# Patient Record
Sex: Male | Born: 1940 | Hispanic: No | Marital: Married | Smoking: Former smoker
Health system: Southern US, Community
[De-identification: ages and names within clinical notes are randomized; demographics above are authoritative.]

## PROBLEM LIST (undated history)

## (undated) DIAGNOSIS — C801 Malignant (primary) neoplasm, unspecified: Secondary | ICD-10-CM

## (undated) DIAGNOSIS — R059 Cough, unspecified: Secondary | ICD-10-CM

## (undated) DIAGNOSIS — J449 Chronic obstructive pulmonary disease, unspecified: Secondary | ICD-10-CM

## (undated) DIAGNOSIS — R05 Cough: Secondary | ICD-10-CM

## (undated) DIAGNOSIS — C189 Malignant neoplasm of colon, unspecified: Secondary | ICD-10-CM

## (undated) DIAGNOSIS — E785 Hyperlipidemia, unspecified: Secondary | ICD-10-CM

## (undated) DIAGNOSIS — K219 Gastro-esophageal reflux disease without esophagitis: Secondary | ICD-10-CM

## (undated) DIAGNOSIS — D649 Anemia, unspecified: Secondary | ICD-10-CM

## (undated) DIAGNOSIS — H9190 Unspecified hearing loss, unspecified ear: Secondary | ICD-10-CM

## (undated) DIAGNOSIS — R519 Headache, unspecified: Secondary | ICD-10-CM

## (undated) DIAGNOSIS — R51 Headache: Secondary | ICD-10-CM

## (undated) DIAGNOSIS — Z9889 Other specified postprocedural states: Secondary | ICD-10-CM

## (undated) DIAGNOSIS — J4489 Other specified chronic obstructive pulmonary disease: Secondary | ICD-10-CM

## (undated) DIAGNOSIS — E871 Hypo-osmolality and hyponatremia: Secondary | ICD-10-CM

## (undated) DIAGNOSIS — S80219A Abrasion, unspecified knee, initial encounter: Secondary | ICD-10-CM

## (undated) DIAGNOSIS — F0280 Dementia in other diseases classified elsewhere without behavioral disturbance: Secondary | ICD-10-CM

## (undated) DIAGNOSIS — G309 Alzheimer's disease, unspecified: Secondary | ICD-10-CM

## (undated) DIAGNOSIS — I10 Essential (primary) hypertension: Secondary | ICD-10-CM

## (undated) DIAGNOSIS — N200 Calculus of kidney: Secondary | ICD-10-CM

## (undated) HISTORY — DX: Essential (primary) hypertension: I10

## (undated) HISTORY — PX: CARDIAC CATHETERIZATION: SHX172

## (undated) HISTORY — DX: Chronic obstructive pulmonary disease, unspecified: J44.9

## (undated) HISTORY — DX: Hypo-osmolality and hyponatremia: E87.1

## (undated) HISTORY — PX: WRIST SURGERY: SHX841

## (undated) HISTORY — DX: Headache, unspecified: R51.9

## (undated) HISTORY — DX: Hyperlipidemia, unspecified: E78.5

## (undated) HISTORY — DX: Headache: R51

## (undated) HISTORY — DX: Other specified chronic obstructive pulmonary disease: J44.89

## (undated) HISTORY — DX: Alzheimer's disease, unspecified: G30.9

## (undated) HISTORY — DX: Dementia in other diseases classified elsewhere, unspecified severity, without behavioral disturbance, psychotic disturbance, mood disturbance, and anxiety: F02.80

## (undated) HISTORY — DX: Cough, unspecified: R05.9

## (undated) HISTORY — DX: Unspecified hearing loss, unspecified ear: H91.90

## (undated) HISTORY — PX: SHOULDER SURGERY: SHX246

## (undated) HISTORY — DX: Gastro-esophageal reflux disease without esophagitis: K21.9

## (undated) HISTORY — PX: OTHER SURGICAL HISTORY: SHX169

## (undated) HISTORY — DX: Cough: R05

## (undated) HISTORY — DX: Malignant neoplasm of colon, unspecified: C18.9

## (undated) HISTORY — PX: COLONOSCOPY: SHX174

## (undated) HISTORY — DX: Anemia, unspecified: D64.9

---

## 1997-08-25 DIAGNOSIS — C189 Malignant neoplasm of colon, unspecified: Secondary | ICD-10-CM

## 1997-08-25 HISTORY — DX: Malignant neoplasm of colon, unspecified: C18.9

## 2000-08-25 DIAGNOSIS — Z9889 Other specified postprocedural states: Secondary | ICD-10-CM

## 2000-08-25 HISTORY — DX: Other specified postprocedural states: Z98.890

## 2002-02-24 ENCOUNTER — Other Ambulatory Visit: Admission: RE | Admit: 2002-02-24 | Discharge: 2002-02-24 | Payer: Self-pay | Admitting: Dermatology

## 2008-08-25 DIAGNOSIS — Z9889 Other specified postprocedural states: Secondary | ICD-10-CM

## 2008-08-25 HISTORY — DX: Other specified postprocedural states: Z98.890

## 2013-09-29 ENCOUNTER — Encounter (HOSPITAL_COMMUNITY): Payer: Self-pay

## 2013-09-29 ENCOUNTER — Encounter (HOSPITAL_COMMUNITY)
Admission: RE | Admit: 2013-09-29 | Discharge: 2013-09-29 | Disposition: A | Discharge status: Home / Self Care | Payer: Medicare Other | Source: Ambulatory Visit | Attending: Internal Medicine | Admitting: Internal Medicine

## 2013-09-29 VITALS — BP 144/86 | HR 51 | Ht 68.0 in | Wt 184.8 lb

## 2013-09-29 DIAGNOSIS — Z5189 Encounter for other specified aftercare: Secondary | ICD-10-CM | POA: Insufficient documentation

## 2013-09-29 DIAGNOSIS — J8410 Pulmonary fibrosis, unspecified: Secondary | ICD-10-CM | POA: Insufficient documentation

## 2013-09-29 DIAGNOSIS — R0602 Shortness of breath: Secondary | ICD-10-CM | POA: Insufficient documentation

## 2013-09-29 HISTORY — DX: Other specified postprocedural states: Z98.890

## 2013-09-29 HISTORY — DX: Calculus of kidney: N20.0

## 2013-09-29 HISTORY — DX: Malignant (primary) neoplasm, unspecified: C80.1

## 2013-09-29 HISTORY — DX: Abrasion, unspecified knee, initial encounter: S80.219A

## 2013-09-29 NOTE — Patient Instructions (Signed)
Pt has finished orientation and is scheduled to start PR on 10/03/13 at 2:30pm. Pt has been instructed to arrive to class 15 minutes early for scheduled class. Pt has been instructed to wear comfortable clothing and shoes with rubber soles. Pt has been told to take their medications 1 hour prior to coming to class.  If the patient is not going to attend class, he/she has been instructed to call.

## 2013-09-29 NOTE — Progress Notes (Signed)
Patient was referred to PR by Dr. Farris Has in [place], [place] for ILD 515. During orientation advised patient on arrival and appointment times what to wear, what to do before, during and after exercise. Reviewed attendance and class policy. Talked about inclement weather and class consultation policy. Pt is scheduled to start Pulmonary Rehab on 10/03/13 at 2:30. Pt was advised to come to class 5 minutes before class starts. He was also given instructions on meeting with the dietician and attending the Family Structure classes. Pt is eager to get started. Patient was able to do the 6 minute walk test. Also discussed the improtance of pursed lip breathing.

## 2013-10-01 ENCOUNTER — Encounter (HOSPITAL_COMMUNITY): Payer: Self-pay

## 2013-10-03 ENCOUNTER — Encounter (HOSPITAL_COMMUNITY)
Admission: RE | Admit: 2013-10-03 | Discharge: 2013-10-03 | Disposition: A | Discharge status: Home / Self Care | Payer: Medicare Other | Source: Ambulatory Visit | Attending: *Deleted | Admitting: *Deleted

## 2013-10-05 ENCOUNTER — Encounter (HOSPITAL_COMMUNITY)
Admission: RE | Admit: 2013-10-05 | Discharge: 2013-10-05 | Disposition: A | Discharge status: Home / Self Care | Payer: Medicare Other | Source: Ambulatory Visit | Attending: *Deleted | Admitting: *Deleted

## 2013-10-10 ENCOUNTER — Encounter (HOSPITAL_COMMUNITY): Payer: Medicare Other

## 2013-10-12 ENCOUNTER — Encounter (HOSPITAL_COMMUNITY): Payer: Medicare Other

## 2013-10-17 ENCOUNTER — Encounter (HOSPITAL_COMMUNITY)
Admission: RE | Admit: 2013-10-17 | Discharge: 2013-10-17 | Disposition: A | Discharge status: Home / Self Care | Payer: Medicare Other | Source: Ambulatory Visit | Attending: *Deleted | Admitting: *Deleted

## 2013-10-18 NOTE — Progress Notes (Signed)
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  Exercise Program:  Tolerates exercise @ 3.80 METS for 15 minutes and Walk Test Results:  Pre: Pre Walk Test: Pre HR 51, BP 144/86, O2 98%, RPE 7 and RPD 7, 6 minute HR 104, BP 154/84, O2 89%, RPE 11 and RPD 13, Post HR 55, BP 140/74, O2 98% RPE 7 and RPD 9. Walked 1000 feet at 1.89 MPH at 2.45 METS.  B.  Mental Health:  Good mental attitude  C.  Education/Instruction/Skills  Accurately checks own pulse.  Rest:  74  Exercise:  90, Knows THR for exercise and Uses Perceived Exertion Scale and/or Dyspnea Scale  Uses Perceived Exertion Scale and/or Dyspnea Scale  D.  Nutrition/Weight Control/Body Composition:  Adherence to prescribed nutrition program: good    E.  Blood Lipids    No results found for this basename: CHOL, HDL, LDLCALC, LDLDIRECT, TRIG, CHOLHDL    F.  Lifestyle Changes:  Making positive lifestyle changes and Not smoking:  Quit 1991  G.  Symptoms noted with exercise:  Asymptomatic  Report Completed By:  Oletta Lamas. Edeline Greening RN   Comments:  This is patients 1st week report. He achieved a Peak METS of 3.80.  His resting HR was 74 and resting BP was 118/74, His peak HR was 90 and peak BP was 128/74. A report will follow upon his 18th visit, his halfway point.

## 2013-10-19 ENCOUNTER — Encounter (HOSPITAL_COMMUNITY)
Admission: RE | Admit: 2013-10-19 | Discharge: 2013-10-19 | Disposition: A | Discharge status: Home / Self Care | Payer: Medicare Other | Source: Ambulatory Visit | Attending: *Deleted | Admitting: *Deleted

## 2013-10-24 ENCOUNTER — Encounter (HOSPITAL_COMMUNITY)
Admission: RE | Admit: 2013-10-24 | Discharge: 2013-10-24 | Disposition: A | Discharge status: Home / Self Care | Payer: Medicare Other | Source: Ambulatory Visit | Attending: Internal Medicine | Admitting: Internal Medicine

## 2013-10-24 DIAGNOSIS — R0602 Shortness of breath: Secondary | ICD-10-CM | POA: Insufficient documentation

## 2013-10-24 DIAGNOSIS — J8410 Pulmonary fibrosis, unspecified: Secondary | ICD-10-CM | POA: Insufficient documentation

## 2013-10-24 DIAGNOSIS — Z5189 Encounter for other specified aftercare: Secondary | ICD-10-CM | POA: Insufficient documentation

## 2013-10-26 ENCOUNTER — Encounter (HOSPITAL_COMMUNITY)
Admission: RE | Admit: 2013-10-26 | Discharge: 2013-10-26 | Disposition: A | Discharge status: Home / Self Care | Payer: Medicare Other | Source: Ambulatory Visit | Attending: *Deleted | Admitting: *Deleted

## 2013-10-31 ENCOUNTER — Encounter (HOSPITAL_COMMUNITY)
Admission: RE | Admit: 2013-10-31 | Discharge: 2013-10-31 | Disposition: A | Discharge status: Home / Self Care | Payer: Medicare Other | Source: Ambulatory Visit | Attending: *Deleted | Admitting: *Deleted

## 2013-11-02 ENCOUNTER — Encounter (HOSPITAL_COMMUNITY)
Admission: RE | Admit: 2013-11-02 | Discharge: 2013-11-02 | Disposition: A | Discharge status: Home / Self Care | Payer: Medicare Other | Source: Ambulatory Visit | Attending: *Deleted | Admitting: *Deleted

## 2013-11-07 ENCOUNTER — Encounter (HOSPITAL_COMMUNITY)
Admission: RE | Admit: 2013-11-07 | Discharge: 2013-11-07 | Disposition: A | Discharge status: Home / Self Care | Payer: Medicare Other | Source: Ambulatory Visit | Attending: *Deleted | Admitting: *Deleted

## 2013-11-09 ENCOUNTER — Encounter (HOSPITAL_COMMUNITY)
Admission: RE | Admit: 2013-11-09 | Discharge: 2013-11-09 | Disposition: A | Discharge status: Home / Self Care | Payer: Medicare Other | Source: Ambulatory Visit | Attending: *Deleted | Admitting: *Deleted

## 2013-11-14 ENCOUNTER — Encounter (HOSPITAL_COMMUNITY)
Admission: RE | Admit: 2013-11-14 | Discharge: 2013-11-14 | Disposition: A | Discharge status: Home / Self Care | Payer: Medicare Other | Source: Ambulatory Visit | Attending: *Deleted | Admitting: *Deleted

## 2013-11-16 ENCOUNTER — Encounter (HOSPITAL_COMMUNITY)
Admission: RE | Admit: 2013-11-16 | Discharge: 2013-11-16 | Disposition: A | Discharge status: Home / Self Care | Payer: Medicare Other | Source: Ambulatory Visit | Attending: *Deleted | Admitting: *Deleted

## 2013-11-21 ENCOUNTER — Encounter (HOSPITAL_COMMUNITY): Payer: Medicare Other

## 2013-11-23 ENCOUNTER — Encounter (HOSPITAL_COMMUNITY)
Admission: RE | Admit: 2013-11-23 | Discharge: 2013-11-23 | Disposition: A | Discharge status: Home / Self Care | Payer: Medicare Other | Source: Ambulatory Visit | Attending: Internal Medicine | Admitting: Internal Medicine

## 2013-11-23 DIAGNOSIS — J8410 Pulmonary fibrosis, unspecified: Secondary | ICD-10-CM | POA: Insufficient documentation

## 2013-11-23 DIAGNOSIS — R0602 Shortness of breath: Secondary | ICD-10-CM | POA: Insufficient documentation

## 2013-11-23 DIAGNOSIS — Z5189 Encounter for other specified aftercare: Secondary | ICD-10-CM | POA: Insufficient documentation

## 2013-11-28 ENCOUNTER — Encounter (HOSPITAL_COMMUNITY)
Admission: RE | Admit: 2013-11-28 | Discharge: 2013-11-28 | Disposition: A | Discharge status: Home / Self Care | Payer: Medicare Other | Source: Ambulatory Visit | Attending: *Deleted | Admitting: *Deleted

## 2013-11-29 NOTE — Progress Notes (Signed)
Pulmonary Rehabilitation Program Outcomes Report   Orientation:  09/29/2013 Halfway report: 11/16/2013 Graduate Date:  tbd Discharge Date:  tbd # of sessions completed: 12 DX: Interstitial Lung Disease (ILD)  Pulmonologist: Gus Height Family MD:  Vear Clock Time:  14:30  A.  Exercise Program:  Tolerates exercise @ 3.81 METS for 15 minutes  B.  Mental Health:  Good mental attitude  C.  Education/Instruction/Skills  Accurately checks own pulse.  Rest:  54  Exercise:  93, Knows THR for exercise and Uses Perceived Exertion Scale and/or Dyspnea Scale  Uses Perceived Exertion Scale and/or Dyspnea Scale  D.  Nutrition/Weight Control/Body Composition:  Adherence to prescribed nutrition program: good    E.  Blood Lipids    No results found for this basename: CHOL, HDL, LDLCALC, LDLDIRECT, TRIG, CHOLHDL    F.  Lifestyle Changes:  Making positive lifestyle changes and Not smoking:  Quit 1991  G.  Symptoms noted with exercise:  Asymptomatic  Report Completed By:  Oletta Lamas. Denis Carreon RN   Comments:  This is patients halfway report. He has done well so far in rehab. He achieved apeak METS of 3.87. His resting HR was 57 and resting BP was 130/58, His peak HR was 95 and peak  BP was 130/72. A graduation report will follow.

## 2013-11-30 ENCOUNTER — Encounter (HOSPITAL_COMMUNITY)
Admission: RE | Admit: 2013-11-30 | Discharge: 2013-11-30 | Disposition: A | Discharge status: Home / Self Care | Payer: Medicare Other | Source: Ambulatory Visit | Attending: *Deleted | Admitting: *Deleted

## 2013-12-05 ENCOUNTER — Encounter (HOSPITAL_COMMUNITY)
Admission: RE | Admit: 2013-12-05 | Discharge: 2013-12-05 | Disposition: A | Discharge status: Home / Self Care | Payer: Medicare Other | Source: Ambulatory Visit | Attending: *Deleted | Admitting: *Deleted

## 2013-12-07 ENCOUNTER — Encounter (HOSPITAL_COMMUNITY)
Admission: RE | Admit: 2013-12-07 | Discharge: 2013-12-07 | Disposition: A | Discharge status: Home / Self Care | Payer: Medicare Other | Source: Ambulatory Visit | Attending: *Deleted | Admitting: *Deleted

## 2013-12-12 ENCOUNTER — Encounter (HOSPITAL_COMMUNITY)
Admission: RE | Admit: 2013-12-12 | Discharge: 2013-12-12 | Disposition: A | Discharge status: Home / Self Care | Payer: Medicare Other | Source: Ambulatory Visit | Attending: *Deleted | Admitting: *Deleted

## 2013-12-14 ENCOUNTER — Encounter (HOSPITAL_COMMUNITY)
Admission: RE | Admit: 2013-12-14 | Discharge: 2013-12-14 | Disposition: A | Discharge status: Home / Self Care | Payer: Medicare Other | Source: Ambulatory Visit | Attending: *Deleted | Admitting: *Deleted

## 2013-12-19 ENCOUNTER — Encounter (HOSPITAL_COMMUNITY)
Admission: RE | Admit: 2013-12-19 | Discharge: 2013-12-19 | Disposition: A | Discharge status: Home / Self Care | Payer: Medicare Other | Source: Ambulatory Visit | Attending: *Deleted | Admitting: *Deleted

## 2013-12-21 ENCOUNTER — Encounter (HOSPITAL_COMMUNITY)
Admission: RE | Admit: 2013-12-21 | Discharge: 2013-12-21 | Disposition: A | Discharge status: Home / Self Care | Payer: Medicare Other | Source: Ambulatory Visit | Attending: *Deleted | Admitting: *Deleted

## 2013-12-26 ENCOUNTER — Encounter (HOSPITAL_COMMUNITY)
Admission: RE | Admit: 2013-12-26 | Discharge: 2013-12-26 | Disposition: A | Discharge status: Home / Self Care | Payer: Medicare Other | Source: Ambulatory Visit | Attending: Internal Medicine | Admitting: Internal Medicine

## 2013-12-26 DIAGNOSIS — J8410 Pulmonary fibrosis, unspecified: Secondary | ICD-10-CM | POA: Insufficient documentation

## 2013-12-26 DIAGNOSIS — R0602 Shortness of breath: Secondary | ICD-10-CM | POA: Insufficient documentation

## 2013-12-26 DIAGNOSIS — Z5189 Encounter for other specified aftercare: Secondary | ICD-10-CM | POA: Insufficient documentation

## 2013-12-28 ENCOUNTER — Encounter (HOSPITAL_COMMUNITY)
Admission: RE | Admit: 2013-12-28 | Discharge: 2013-12-28 | Disposition: A | Discharge status: Home / Self Care | Payer: Medicare Other | Source: Ambulatory Visit | Attending: *Deleted | Admitting: *Deleted

## 2014-01-02 ENCOUNTER — Encounter (HOSPITAL_COMMUNITY)
Admission: RE | Admit: 2014-01-02 | Discharge: 2014-01-02 | Disposition: A | Discharge status: Home / Self Care | Payer: Medicare Other | Source: Ambulatory Visit | Attending: Internal Medicine | Admitting: Internal Medicine

## 2014-01-24 NOTE — Progress Notes (Signed)
Pulmonary Rehabilitation Program Outcomes Report   Orientation:  09/29/2013 Graduate Date:  01/02/2014 Discharge Date:  01/02/2014 # of sessions completed: 24 DX; ILD bronchiectasis  Pulmonologist: Farris Has Family MD:  Vear Clock Time:  14:30  A.  Exercise Program:  Tolerates exercise @ 3.86 METS for 15 minutes, Walk Test Results:  Post: Post walk Test; Rest HR 85, BP 132/62, O2 95% RPE 7 and RPD 7, 6 min HR 95, BP 158/82, O2 94% RPE 11 and RPD 11, Post HR 65, BP 138/70, O2 99% RPE 7 and RPD 7. Walked 1374ft at 2.46 mph, at 2.88 METS. and Discharged to home exercise program.  Anticipated compliance:  excellent  B.  Mental Health:  Good mental attitude  C.  Education/Instruction/Skills  Accurately checks own pulse.  Rest:  85  Exercise: 89, Knows THR for exercise, Uses Perceived Exertion Scale and/or Dyspnea Scale and Attended 11 education classes  Demonstrates accurate pursed lip breathing  D.  Nutrition/Weight Control/Body Composition:  Adherence to prescribed nutrition program: good    E.  Blood Lipids    No results found for this basename: CHOL, HDL, LDLCALC, LDLDIRECT, TRIG, CHOLHDL    F.  Lifestyle Changes:  Making positive lifestyle changes  G.  Symptoms noted with exercise:  Asymptomatic  Report Completed By:  Oletta Lamas. Rik Wadel RN   Comments:  This is patients graduation report.  He has done well in rehab. He achieved a peak METS of 3.86. His resting HR was 85 and resting BP was 132/62 and Peak HR was 89 and Peak BP was 150/72.  A call will be made to patient upon his 1 mnth, 6 month,and 1year mark post graduation to ensure exercise compliance.

## 2015-03-29 ENCOUNTER — Other Ambulatory Visit: Payer: Self-pay | Admitting: Specialist

## 2015-03-29 DIAGNOSIS — R519 Headache, unspecified: Secondary | ICD-10-CM

## 2015-03-29 DIAGNOSIS — R51 Headache: Principal | ICD-10-CM

## 2015-04-04 ENCOUNTER — Ambulatory Visit
Admission: RE | Admit: 2015-04-04 | Discharge: 2015-04-04 | Disposition: A | Discharge status: Home / Self Care | Payer: Medicare Other | Source: Ambulatory Visit | Attending: Specialist | Admitting: Specialist

## 2015-04-04 ENCOUNTER — Other Ambulatory Visit: Payer: Self-pay | Admitting: Specialist

## 2015-04-04 DIAGNOSIS — R51 Headache: Principal | ICD-10-CM

## 2015-04-04 DIAGNOSIS — R519 Headache, unspecified: Secondary | ICD-10-CM

## 2015-04-04 LAB — CSF CELL COUNT WITH DIFFERENTIAL
RBC Count, CSF: 2 cu mm — ABNORMAL HIGH
TUBE #: 3
WBC CSF: 0 uL (ref 0–5)

## 2015-04-04 LAB — GLUCOSE, CSF: Glucose, CSF: 52 mg/dL (ref 43–76)

## 2015-04-04 LAB — PROTEIN, CSF: TOTAL PROTEIN, CSF: 56 mg/dL — AB (ref 15–45)

## 2015-04-04 NOTE — Discharge Instructions (Signed)

## 2015-04-04 NOTE — Progress Notes (Signed)
One SST tube of blood drawn from left AC space for LP labs; site unremarkable.   

## 2015-04-07 LAB — CRYPTOCOCCAL AG, LTX SCR RFLX TITER: CRYPTOCOCCAL AG SCREEN: NOT DETECTED

## 2015-04-07 LAB — ANGIOTENSIN CONVERTING ENZYME, CSF: ACE, CSF: 8 U/L (ref <=15)

## 2015-04-07 LAB — VDRL, CSF: VDRL Quant, CSF: NONREACTIVE

## 2015-04-11 LAB — B. BURGDORFI ANTIBODIES, CSF: Lyme Ab: NEGATIVE

## 2015-04-12 LAB — HSV(HERPES SMPLX VRS)ABS-I+II(IGG)-CSF

## 2015-05-02 LAB — FUNGUS CULTURE W SMEAR: SMEAR RESULT: NONE SEEN

## 2016-07-25 HISTORY — PX: OTHER SURGICAL HISTORY: SHX169

## 2016-11-27 IMAGING — XA DG FLUORO GUIDE LUMBAR PUNCTURE
1 series · 2 of 2 positions shown · non-contrast
Comparison: none

CLINICAL DATA: Intractable headache.

[Series 1: ortho adipose · 2 of 2 slices shown]
[im 1/2]
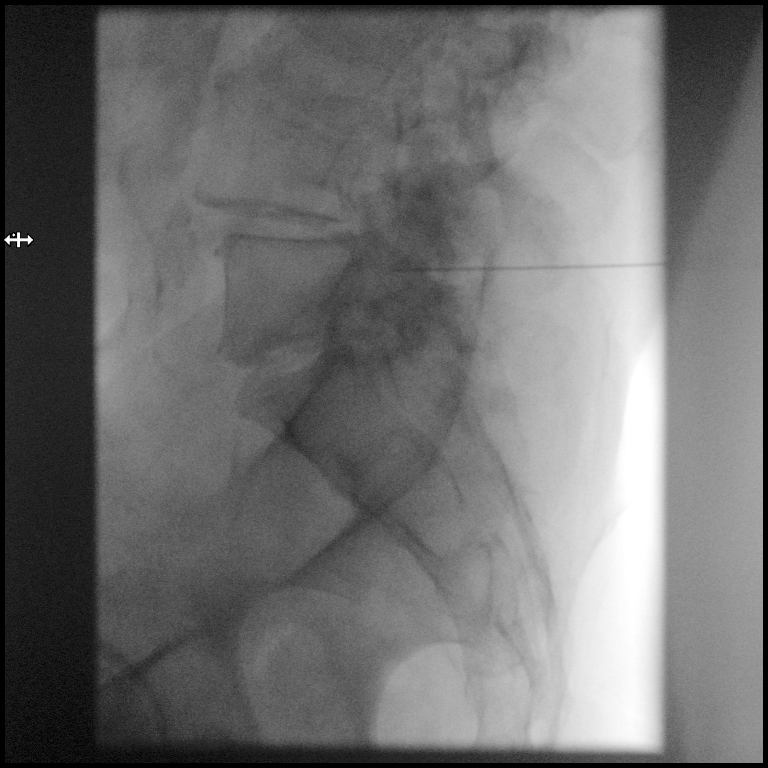
[im 2/2]
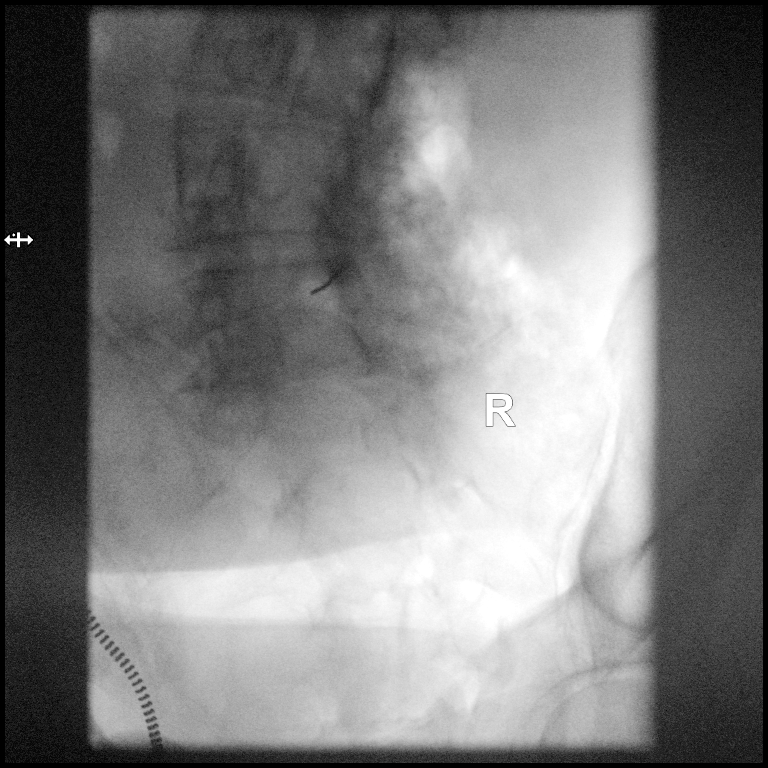

[2 of 2 positions shown; findings below may reference images not displayed]

EXAM:
DIAGNOSTIC LUMBAR PUNCTURE UNDER FLUOROSCOPIC GUIDANCE

FLUOROSCOPY TIME:  Radiation Exposure Index (as provided by the
fluoroscopic device): Dose Area Product of 200 ?Gy*m2

PROCEDURE:
Informed consent was obtained from the patient prior to the
procedure, including potential complications of headache, allergy,
and pain. With the patient lateral decubitus, the lower back was
prepped with Betadine. 1% Lidocaine was used for local anesthesia.
Lumbar puncture was performed at the L4-5 level using a 20 gauge
needle with return of clear CSF with an opening pressure of 11 cm
water. 14.0 ml of CSF were obtained for laboratory studies. Closing
pressure was 8 cm water. The patient tolerated the procedure well
and there were no apparent complications.
IMPRESSION: Technically successful diagnostic lumbar puncture under fluoroscopic
guidance. Normal opening and closing pressure. Fluid was clear.

## 2017-07-25 HISTORY — PX: OTHER SURGICAL HISTORY: SHX169

## 2018-05-24 ENCOUNTER — Encounter: Payer: Self-pay | Admitting: Neurology

## 2018-05-24 ENCOUNTER — Ambulatory Visit (INDEPENDENT_AMBULATORY_CARE_PROVIDER_SITE_OTHER): Payer: Medicare Other | Admitting: Neurology

## 2018-05-24 VITALS — BP 153/79 | HR 51 | Ht 66.0 in | Wt 170.0 lb

## 2018-05-24 DIAGNOSIS — R51 Headache with orthostatic component, not elsewhere classified: Secondary | ICD-10-CM

## 2018-05-24 NOTE — Progress Notes (Signed)
[facility]    Provider:  Dr Jaynee Eagles Referring Provider: Mikey Bussing Candida Peeling, MD Primary Care Physician:  Donalynn Furlong, MD  CC:  headache  HPI:  Trevor Fuller is a 77 y.o. male here as requested by Dr. Chauncey Fischer for headache. He has a history of csf leak, confirmed with MRI of the brain, classic symptoms worse standing resolves supine. Past medical history Alzheimer's disease, dyslipidemia, hypertension, headache, high risk medication use, nocturia, COPD, cervical spine instability, chronic headaches, CSF leak with dural enhancement on his scans and has received blood patches and other interventions at [facility].He has been to [facility] Headache clinic and goes to the csf low pressure clinic with Dr. Veleta Miners. Laying down is the only thing that will make it better is laying own. Apparently patient went to Dr. Elvia Collum at [facility] for his headache care as well and Dr. Doy Mince stated he has to follow with Dr. Veleta Miners.  He is also been to the headache wellness center in the past. Today here to see if I have anything else to offer.  He follows with Dr. Veleta Miners at [facility] and has had multiple blood patch and fibrin patches for low-csf questions. No trauma, no inciting events. It is so painful he cries, his wife provides most information and feel they cannot go on like this. No other focal neurologic deficits, associated symptoms, inciting events or modifiable factors.  Reviewed notes, labs and imaging from outside physicians, which showed:  Reviewed notes from neurologist at [facility] that he just saw on July 2019.  Patient is cognitively limited and his wife provides a fair amount of history.  He has had daily headaches for the last 3-1/2 years.  Started about 1/2-hour later and persist all day long until he lies down.  In which case they do improve.  They vary in intensity over the course the day and he cannot identify specific exacerbating or mitigating factors other than relief with lying down.   On specific questioning he does endorse worsening with coughing or sneezing.  When severe he can have blurred vision with the headaches.  No precipitating event prior to the onset of headaches.  He had a lumbar puncture in 2016 in [place] with opening pressure of 11 cmH2O.  He later had brain MRI which showed enhancement thought related to low CSF pressure.  And he has seen Dr. Pearline Cables on a couple of occasions for procedures to attempt to repair suspected CSF leaks most recent in December 2018.  He says he did get transient improvement after the seat seizure but did not this last time.  He takes Excedrin 1-2 times a week for the headache which he says works better than any prescription medication he has tried.  Reviewed notes from referring physician Dr. Peter Minium patient has chronic headaches.  It appears he was on Cymbalta and then stopped because that may have been causing the headaches.  Also reviewed notes from Dr Mikey Bussing, Candida Peeling, MD at [facility].  This physician is at the memory disorders clinic.  Per this physician's history patient has a history of chronic headaches, has a history of CSF leak with dural enhancement on his scans and has received blood patch from radiology, last patch was 07/27/2017.  He saw neurosurgery for his headaches on October 26, 2016 which were possibly thought to be secondary to see 1 C2 instability.  Memory problem started 3 weeks prior to being seen at the [place] memory loss clinic.  Prior MRIs September 05, 2015  showed increased ventricular size with ventricular asymmetry left greater than right, generalized atrophy, precuneus atrophy.  Formal neurocognitive testing was consistent with Alzheimer's disease, progressive, his MRI shows signs of neurodegenerative disease process.  They considered starting donepezil however patient has bradycardia, at this time of the neurocognitive testing he had moderate dementia.  He was asked to start B12.  No driving.  LP 03/2015 from Dr. Domingo Cocking  showed opening pressure 11   Review of Systems: Patient complains of symptoms per HPI as well as the following symptoms: headache, memory loss. Pertinent negatives and positives per HPI. All others negative.   Social History   Socioeconomic History  . Marital status: Married    Spouse name: Inez Catalina  . Number of children: 4  . Years of education: Not on file  . Highest education level: High school graduate  Occupational History  . Not on file  Social Needs  . Financial resource strain: Not on file  . Food insecurity:    Worry: Not on file    Inability: Not on file  . Transportation needs:    Medical: Not on file    Non-medical: Not on file  Tobacco Use  . Smoking status: Former Smoker    Packs/day: 0.50    Years: 36.00    Pack years: 18.00    Last attempt to quit: 03/14/1990    Years since quitting: 28.2  . Smokeless tobacco: Never Used  Substance and Sexual Activity  . Alcohol use: No    Comment: quit 1991  . Drug use: Never  . Sexual activity: Not on file  Lifestyle  . Physical activity:    Days per week: Not on file    Minutes per session: Not on file  . Stress: Not on file  Relationships  . Social connections:    Talks on phone: Not on file    Gets together: Not on file    Attends religious service: Not on file    Active member of club or organization: Not on file    Attends meetings of clubs or organizations: Not on file    Relationship status: Not on file  . Intimate partner violence:    Fear of current or ex partner: Not on file    Emotionally abused: Not on file    Physically abused: Not on file    Forced sexual activity: Not on file  Other Topics Concern  . Not on file  Social History Narrative   Lives at home with his wife   Retired   Right handed   Caffeine: 2 cups a day    Family History  Problem Relation Age of Onset  . Other Father        brain tumor  . Colon cancer Sister   . Colon cancer Brother     Past Medical History:  Diagnosis  Date  . Alzheimer's disease   . Anemia   . Cancer ([place])   . Chronic GERD   . Colon cancer ([place]) 1999  . COPD with asthma ([place])   . Cough   . Dyslipidemia   . H/O left knee surgery 2010  . H/O right knee surgery 2002  . Headache   . Hearing loss    bilateral hearing aids   . High blood pressure   . Hyponatremia   . Kidney stones   . Knee abrasion     Past Surgical History:  Procedure Laterality Date  . arthroscopy of knee Bilateral   . bone density  study  07/2016  . CARDIAC CATHETERIZATION    . COLONOSCOPY    . inititial lung disease    . SHOULDER SURGERY Bilateral    4 times   . spinal blood patch  07/2017  . WRIST SURGERY Left     Current Outpatient Medications  Medication Sig Dispense Refill  . albuterol (PROAIR HFA) 108 (90 BASE) MCG/ACT inhaler Inhale 2 puffs into the lungs as needed.     Marland Kitchen amLODipine (NORVASC) 10 MG tablet Take 10 mg by mouth daily.    Marland Kitchen b complex vitamins tablet Take 1 tablet by mouth daily.    . budesonide (PULMICORT) 0.5 MG/2ML nebulizer solution Take by nebulization daily.     Marland Kitchen donepezil (ARICEPT) 5 MG tablet Take 5 mg by mouth daily.    Marland Kitchen esomeprazole (NEXIUM) 20 MG capsule Take 20 mg by mouth daily at 12 noon.    . food thickener (SIMPLYTHICK) POWD Take by mouth as needed.    Marland Kitchen guaiFENesin (MUCINEX PO) Take by mouth 2 (two) times daily as needed.    . hydrALAZINE (APRESOLINE) 25 MG tablet Take 25 mg by mouth 2 (two) times daily.    Marland Kitchen ipratropium-albuterol (DUONEB) 0.5-2.5 (3) MG/3ML SOLN Take 3 mLs by nebulization. 3-4 times daily if needed    . memantine (NAMENDA) 10 MG tablet Take 10 mg by mouth 2 (two) times daily.    . polyethylene glycol powder (GLYCOLAX/MIRALAX) powder Take by mouth.    . ranitidine (ZANTAC) 150 MG capsule Take 150 mg by mouth every evening.    . simvastatin (ZOCOR) 40 MG tablet Take 40 mg by mouth daily at 6 PM.     . gabapentin (NEURONTIN) 800 MG tablet Take by mouth.     No current facility-administered  medications for this visit.     Allergies as of 05/24/2018 - Review Complete 05/24/2018  Allergen Reaction Noted  . Inhaler decongestant [methamphetamine] Other (See Comments) 10/01/2013  . Elavil [amitriptyline] Other (See Comments) 10/01/2013  . Oxycodone Other (See Comments) 10/01/2013  . Cymbalta [duloxetine hcl]  05/24/2018  . Fluticasone-salmeterol Other (See Comments) 04/04/2015  . Lorazepam Other (See Comments) 04/04/2015  . Percocet [oxycodone-acetaminophen] Other (See Comments) 10/01/2013  . Tramadol Anxiety and Other (See Comments) 04/04/2015    Vitals: BP (!) 153/79 (BP Location: Right Arm, Patient Position: Sitting)   Pulse (!) 51   Ht 5\' 6"  (1.676 m)   Wt 170 lb (77.1 kg)   BMI 27.44 kg/m  Last Weight:  Wt Readings from Last 1 Encounters:  05/24/18 170 lb (77.1 kg)   Last Height:   Ht Readings from Last 1 Encounters:  05/24/18 5\' 6"  (1.676 m)    Physical exam: Exam: Gen: NAD, thin, not conversant                 CV: RRR, no MRG. No Carotid Bruits. No peripheral edema, warm, nontender Eyes: Conjunctivae clear without exudates or hemorrhage  Neuro: Detailed Neurologic Exam  Speech:    Speech is without aphasia Cognition:    The patient is oriented to person    recent and remote memory impaired;     language fluent;     Impaired attention, concentration,  fund of knowledge Cranial Nerves:    The pupils are equal, round, and reactive to light. Attempted fundoscopic exam could not visualize.  Visual fields are full to finger confrontation. Left esotropia otherwise extraocular movements are intact. Trigeminal sensation is intact and the muscles of mastication are normal. Lower  facial weakness. The palate elevates in the midline. Hearing intact. Voice is normal. Shoulder shrug is normal. The tongue has normal motion without fasciculations.   Coordination:    No dysmetria   Gait:    Bradykinetic, stooped, with walking aid, not ataxic however  Motor  Observation:    No asymmetry, no atrophy, and no involuntary movements noted. Tone:    Normal muscle tone.      Strength:    Strength is V/V in the upper and lower limbs.      Sensation: intact to LT     Reflex Exam:  DTR's:    Deep tendon reflexes in the upper and lower extremities are symmetrical bilaterally.   Toes:    The toes are equiv bilaterally.   Clonus:    Clonus is absent.      Assessment/Plan:  77 year old very unfortunate man here to see if we can help with his low-pressure headache. He had spontaneous csf leak several years ago, positional severe headaches, MRI showed signs of intracranial hypotension.   - Patient is seen at [facility] with Dr. Veleta Miners at the low-csf clinic. He has had extensive workup and interventions including blood patches and fibrin glue patches. Unfortunately I have no more to offer him here, Dr. Veleta Miners is an expert in this condition and is well known around the [place] I fee they have to go back to her for further assessment and plan. I did apologize for his pain and my heart goes out to them. [facility] and [facility] can offer them the best care or referral elsewhere. He should be seen in their headache clinic as well in coordination with [place] [facility] low-csf clinic.  -He has also been seen at the [facility] memory disorders clinic and had formal neurocognitive testing which showed moderately advanced Alzheimer's dementia started on Aricept 5 mg.  Appears he was seen there on April 07, 2018 with the last visit. Continue to follow  Cc: Dhivianathan, Candida Peeling, MD  Sarina Ill, MD  [facility] [address] [place], [place] [address]  Phone [phone] Fax [phone]

## 2018-05-26 DIAGNOSIS — R51 Headache with orthostatic component, not elsewhere classified: Secondary | ICD-10-CM | POA: Insufficient documentation

## 2018-07-06 ENCOUNTER — Telehealth: Payer: Self-pay | Admitting: Neurology

## 2018-07-06 NOTE — Telephone Encounter (Signed)
Pt wife(on DPR-Hor,Betty) has called to inform that on last Friday pt had worst headache ever in past 5 years so he was taken to [facility] in [place] and saw Dr Blanch Media.  Wife states pt was diagnosed with Occipital Neuralgia, wife wants to know if Dr Jaynee Eagles will treat him for that, please call.  Wife states that night pt was given 4 nerve blocks

## 2018-07-06 NOTE — Telephone Encounter (Signed)
I can treat him for occipital neuralgia. But unfortunately not the low pressure headaches, I hope he was able to see Dr. Veleta Miners. The nerve blocks may have helped him, sometimes they help for several months.  if he needs more he can call and we will get him in.

## 2018-07-07 NOTE — Telephone Encounter (Signed)
I called pt's wife, Inez Catalina, per DPR. No answer, left a message asking her to call me back.

## 2018-07-07 NOTE — Telephone Encounter (Signed)
I called pt's wife back, Inez Catalina, per DPR. I gave her Dr. Cathren Laine recommendations. Pt's wife reports that Dr. Veleta Miners performed some procedures (unsure of which ones) that made pt's headaches worse. Pt still has headaches and is "suffering." Pt's wife is agreeable to pt returning for a nerve block and is wondering if he can be worked in for a nerve block soon?

## 2018-07-07 NOTE — Telephone Encounter (Signed)
Pt  Has returned call to RN Cyril Mourning, please call

## 2018-07-07 NOTE — Telephone Encounter (Signed)
If Dr. Veleta Miners did something to make his headaches worse they have to talk to Dr. Pearline Cables. The initial message stated occipital neuralgia but he just had a nerve block which should last longer than this. I think his headaches may be due to his original problem so he needs to see Dr. Veleta Miners if she made it worse and I am not comfortable treating it with a nerve block. If indeed this is occipital neuralgia and he needs nerve blocks so soon again then I recommend orthopaedics to see if they can provide a procedure that lasts longer and also image his neck to see if he had issues in his neck causing the occipital neuralgia. I left this messsage on their home phone number tonight, if they call back please re-iterate or maybe you can call them Trevor Fuller thanks

## 2018-07-08 NOTE — Telephone Encounter (Signed)
I called pt's wife, Inez Catalina, per DPR. She reports that she did get Dr. Cathren Laine message and agrees with Dr. Jaynee Eagles. Pt's wife will contact Dr. Darlin Drop office today to discuss pt's condition and his worsening symptoms after the injections done by Dr. Veleta Miners. Pt's wife was very thankful for Dr. Cathren Laine concern.

## 2020-04-04 ENCOUNTER — Encounter (INDEPENDENT_AMBULATORY_CARE_PROVIDER_SITE_OTHER): Payer: Self-pay | Admitting: Gastroenterology

## 2020-04-04 ENCOUNTER — Other Ambulatory Visit: Payer: Self-pay

## 2020-04-04 ENCOUNTER — Ambulatory Visit (INDEPENDENT_AMBULATORY_CARE_PROVIDER_SITE_OTHER): Payer: Medicare Other | Admitting: Gastroenterology

## 2020-04-04 VITALS — BP 137/93 | HR 94 | Temp 97.8°F | Ht 67.0 in | Wt 167.9 lb

## 2020-04-04 DIAGNOSIS — K219 Gastro-esophageal reflux disease without esophagitis: Secondary | ICD-10-CM

## 2020-04-04 DIAGNOSIS — K5900 Constipation, unspecified: Secondary | ICD-10-CM

## 2020-04-04 DIAGNOSIS — R634 Abnormal weight loss: Secondary | ICD-10-CM

## 2020-04-04 MED ORDER — PANTOPRAZOLE SODIUM 20 MG PO TBEC
20.0000 mg | DELAYED_RELEASE_TABLET | Freq: Every day | ORAL | 3 refills | Status: DC
Start: 2020-04-04 — End: 2020-06-27

## 2020-04-04 NOTE — Progress Notes (Signed)
Patient profile: Trevor Fuller is a 79 y.o. male seen for evaluation of constipation, weight loss, history of pancreatitis  History of Present Illness: Trevor Fuller is seen today as a new patient to our clinic.  [facility] notes have been reviewed - reports dysphagia began 2000 after neck surgery, had aspiration on barium swallows in 2013 and 2017.  He had esophageal manometry in 2017.  Ultimately had speech therapy in 2019 which was discontinued due to chronic headaches.  Wife accompanies and provides all of the history given patient's baseline dementia. Wife reports gradual weight loss - she reports chronic dysphagia and patient has been eating slower due to dysphagia.  She feels like patient gets tired of chewing frequently and stops eating early.  She denies any postprandial abdominal pain. Some cough w/ oral intake but fairly infrequent. No nausea/vomiting.  Wife reports a lot of GERD with belching symptoms, has been on famotidine about 1 year. Reports neixum was stopped about a year ago (wife feels this caused cough to be worse but on further questioning cough may be more COPD related).   Nov 2020 admitted in [place] for pneumonia and found to have elevated enzymes, imaging showed gallstones. Had CCY during admission. Wife denies bowel changes following CCY.  Currently stools are constipated, reports usually only moves stools w/ senna, wife gives Senna most days. Last good BM was 4 days ago. Endorses some distention. No blood in stools.  Chronic bowel habits were daily BM until few months ago. Also has tried miralax daily. On 40oz fluid limit for CKD. Eats fruit each morning. Has limited mobility (in wheelchair).   Patient also followed by palliative medicine.    Colon cancer dx 1999-surgically removed, no chemo/radiation, per wife stopped routine colonoscopies in 2019 (20 years after dx)   Wt Readings from Last 3 Encounters:  04/04/20 167 lb 14.4 oz (76.2 kg)  05/24/18 170 lb  (77.1 kg)  09/29/13 184 lb 12.8 oz (83.8 kg)      10/2016-EGD path- (unable to find actual EGD report in care everywhere)- Mild chronic inactive gastritis. No intestinal metaplasia or dysplasia is seen. Immunohistochemistry for Helicobacter pylori will be reported as an addendum.   B. 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 Alzheimer's disease ([place])   . Anemia   . Cancer ([place])   . Chronic GERD   . Colon cancer ([place]) 1999  . COPD with asthma ([place])   . Cough   . Dyslipidemia   . H/O left knee surgery 2010  . H/O right knee surgery 2002  . Headache   . Hearing loss    bilateral hearing aids   . High blood pressure   . Hyponatremia   . Kidney stones   . Knee abrasion     Problem List: Patient Active Problem List   Diagnosis Date Noted  . Headache due to low cerebrospinal fluid pressure 05/26/2018    Past Surgical History: Past Surgical History:  Procedure Laterality Date  . arthroscopy of knee Bilateral   . bone density study  07/2016  . CARDIAC CATHETERIZATION    . COLONOSCOPY    . inititial lung disease    . SHOULDER SURGERY Bilateral    4 times   . spinal blood patch  07/2017  . WRIST SURGERY Left     Allergies:  Allergies  Allergen Reactions  . Inhaler Decongestant [Methamphetamine] Other (See Comments)    Causes fungus on vocal cords  . Elavil [Amitriptyline] Other (See Comments)    "Makes me like a zombie" Severe Lethargy  . Oxycodone Other (See Comments)    Lethargy  . Cymbalta [Duloxetine Hcl]     High blood pressure, low sodium  . Fluticasone-Salmeterol Other (See Comments)    Caused fungus on vocal chords  . Lorazepam Other (See Comments)  . Percocet [Oxycodone-Acetaminophen] Other (See Comments)    Unable to use bathroom; constipation  . Tramadol Anxiety and  Other (See Comments)    retention      Home Medications:  Current Outpatient Medications:  .  albuterol (PROAIR HFA) 108 (90 BASE) MCG/ACT inhaler, Inhale 2 puffs into the lungs as needed. , Disp: , Rfl:  .  amLODipine (NORVASC) 10 MG tablet, Take 10 mg by mouth daily., Disp: , Rfl:  .  b complex vitamins tablet, Take 1 tablet by mouth daily., Disp: , Rfl:  .  benzonatate (TESSALON) 100 MG capsule, Take by mouth as needed for cough., Disp: , Rfl:  .  budesonide (PULMICORT) 0.5 MG/2ML nebulizer solution, Take by nebulization daily. , Disp: , Rfl:  .  Eptinezumab-jjmr (VYEPTI) 100 MG/ML injection, Inject into the vein. Infused every 90 days and is administered at [facility]., Disp: , Rfl:  .  famotidine (PEPCID) 20 MG tablet, Take 20 mg by mouth., Disp: , Rfl:  .  food thickener (SIMPLYTHICK) POWD, Take by mouth as needed., Disp: , Rfl:  .  guaiFENesin (MUCINEX PO), Take by mouth 2 (two) times daily as needed., Disp: , Rfl:  .  hydrALAZINE (APRESOLINE) 25 MG tablet, Take 25 mg by mouth 2 (two) times daily., Disp: , Rfl:  .  ipratropium-albuterol (DUONEB) 0.5-2.5 (3) MG/3ML SOLN, Take 3 mLs by nebulization. 3-4 times daily if needed, Disp: , Rfl:  .  memantine (NAMENDA) 10 MG tablet, Take 10 mg by mouth 2 (two) times daily., Disp: , Rfl:  .  SENNA PO, Take by mouth. 1 by mouth twice a day as needed for constipation., Disp: , Rfl:  .  spironolactone (ALDACTONE) 50 MG tablet, Take 50 mg by mouth daily., Disp: , Rfl:  .  Vitamin D, Ergocalciferol, (DRISDOL) 1.25 MG (50000 UNIT) CAPS capsule, Take 50,000 Units by mouth every 7 (seven) days., Disp: , Rfl:  .  gabapentin (NEURONTIN) 800 MG tablet, Take by mouth., Disp: , Rfl:  .  pantoprazole (PROTONIX) 20 MG tablet, Take 1 tablet (20 mg total) by mouth daily., Disp: 30 tablet, Rfl: 3   Family History: family history includes Colon cancer in his brother and sister; Other in his father.    Social History:   reports that he quit smoking about 30 years  ago. He has a 18.00 pack-year smoking history. He has never used smokeless tobacco. He reports that he does not drink alcohol and does not use drugs.   Review of Systems: Constitutional: Denies weight loss/weight gain  Eyes: No changes in vision. ENT: No oral lesions, sore throat.  GI: see HPI.  Heme/Lymph: No easy bruising.  CV: No chest pain.  GU: No hematuria.  Integumentary: No rashes.  Neuro: No headaches.  Psych: No depression/anxiety.  Endocrine: No heat/cold intolerance.  Allergic/Immunologic: No urticaria.  Resp: No cough, SOB.  Musculoskeletal: No joint swelling.    Physical Examination: BP (!) 137/93 (BP Location: Right Arm, Patient Position: Sitting, Cuff Size: Normal)   Pulse 94  Temp 97.8 F (36.6 C) (Oral)   Ht 5\' 7"  (1.702 m)   Wt 167 lb 14.4 oz (76.2 kg)   BMI 26.30 kg/m  Gen: NAD, alert and oriented but wife provides all history.  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    Assessment/Plan: Mr. Som is a 79 y.o. male  Travon was seen today for new patient (initial visit).  Diagnoses and all orders for this visit:  Constipation, unspecified constipation type -     CBC with Differential -     COMPLETE METABOLIC PANEL WITH GFR -     Lipase  Loss of weight -     CBC with Differential -     COMPLETE METABOLIC PANEL WITH GFR -     Lipase  Gastroesophageal reflux disease, unspecified whether esophagitis present -     CBC with Differential -     COMPLETE METABOLIC PANEL WITH GFR -     Lipase  Other orders -     pantoprazole (PROTONIX) 20 MG tablet; Take  1 tablet (20 mg total) by mouth daily.     1. Constipation-suspect this is due to medications, decreased mobility, etc.  Will try increasing MiraLAX from once a day to twice a day and using senna as needed.  Given his age and comorbidities with worsening Alzheimer's wife and I agree colonoscopy is not feasible as patient would not understand drinking the prep.  Will check basic labs.  If does not respond to MiraLAX consider Linzess.  2. GERD-having symptoms with famotidine at night, had questionable side effect of cough with Nexium.  Will try low-dose Protonix in the morning.  Overall his GERD symptoms seem fairly mild.  3. History of pancreatitis-had what sounds like asymptomatic gallstone pancreatitis noted during an admission for pneumonia with elevated LFTs ([facility] recs not available). Ultimately had CCY. suspect his lipase will have improved but wife requests to recheck this  4.  Dysphagia-chronic issue with history of achalasia and seems relatively stable.  History of COPD, bronchiectasis with one recent hospitalization for pneumonia due to questionable aspiration.  Also followed by pulmonology.  5.  Weight loss-May be related to his worsening dementia and decline in functional status.  He is followed by palliative medicine  Patient will follow up in office for 6 weeks to ensure he is improving with medication changes made today  I personally performed the service, non-incident to. (WP)  Laurine Blazer, [facility]

## 2020-04-04 NOTE — Patient Instructions (Signed)
Start Protonix 20 mg each morning, continue Pepcid in the evening MiraLAX twice a day as discussed-okay to continue as Senna  We are checking labs today.

## 2020-04-05 LAB — COMPLETE METABOLIC PANEL WITH GFR
AG Ratio: 2 (calc) (ref 1.0–2.5)
ALT: 21 U/L (ref 9–46)
AST: 24 U/L (ref 10–35)
Albumin: 4.7 g/dL (ref 3.6–5.1)
Alkaline phosphatase (APISO): 67 U/L (ref 35–144)
BUN/Creatinine Ratio: 20 (calc) (ref 6–22)
BUN: 26 mg/dL — ABNORMAL HIGH (ref 7–25)
CO2: 27 mmol/L (ref 20–32)
Calcium: 10.4 mg/dL — ABNORMAL HIGH (ref 8.6–10.3)
Chloride: 99 mmol/L (ref 98–110)
Creat: 1.33 mg/dL — ABNORMAL HIGH (ref 0.70–1.11)
GFR, Est African American: 58 mL/min/{1.73_m2} — ABNORMAL LOW (ref >=60)
GFR, Est Non African American: 50 mL/min/{1.73_m2} — ABNORMAL LOW (ref >=60)
Globulin: 2.3 g/dL (calc) (ref 1.9–3.7)
Glucose, Bld: 69 mg/dL (ref 65–99)
Potassium: 4.9 mmol/L (ref 3.5–5.3)
Sodium: 134 mmol/L — ABNORMAL LOW (ref 135–146)
Total Bilirubin: 0.8 mg/dL (ref 0.2–1.2)
Total Protein: 7 g/dL (ref 6.1–8.1)

## 2020-04-05 LAB — CBC WITH DIFFERENTIAL/PLATELET
Absolute Monocytes: 554 cells/uL (ref 200–950)
Basophils Absolute: 44 cells/uL (ref 0–200)
Basophils Relative: 0.5 %
Eosinophils Absolute: 70 cells/uL (ref 15–500)
Eosinophils Relative: 0.8 %
HCT: 48.3 % (ref 38.5–50.0)
Hemoglobin: 17.2 g/dL — ABNORMAL HIGH (ref 13.2–17.1)
Lymphs Abs: 1646 cells/uL (ref 850–3900)
MCH: 32.7 pg (ref 27.0–33.0)
MCHC: 35.6 g/dL (ref 32.0–36.0)
MCV: 91.8 fL (ref 80.0–100.0)
MPV: 10.5 fL (ref 7.5–12.5)
Monocytes Relative: 6.3 %
Neutro Abs: 6486 cells/uL (ref 1500–7800)
Neutrophils Relative %: 73.7 %
Platelets: 216 10*3/uL (ref 140–400)
RBC: 5.26 10*6/uL (ref 4.20–5.80)
RDW: 13 % (ref 11.0–15.0)
Total Lymphocyte: 18.7 %
WBC: 8.8 10*3/uL (ref 3.8–10.8)

## 2020-04-05 LAB — LIPASE: Lipase: 29 U/L (ref 7–60)

## 2020-05-09 ENCOUNTER — Encounter (INDEPENDENT_AMBULATORY_CARE_PROVIDER_SITE_OTHER): Payer: Self-pay

## 2020-06-04 ENCOUNTER — Encounter (INDEPENDENT_AMBULATORY_CARE_PROVIDER_SITE_OTHER): Payer: Self-pay | Admitting: Gastroenterology

## 2020-06-04 ENCOUNTER — Other Ambulatory Visit: Payer: Self-pay

## 2020-06-04 ENCOUNTER — Ambulatory Visit (INDEPENDENT_AMBULATORY_CARE_PROVIDER_SITE_OTHER): Payer: Medicare Other | Admitting: Gastroenterology

## 2020-06-04 DIAGNOSIS — R066 Hiccough: Secondary | ICD-10-CM

## 2020-06-04 DIAGNOSIS — K5900 Constipation, unspecified: Secondary | ICD-10-CM | POA: Insufficient documentation

## 2020-06-04 DIAGNOSIS — K219 Gastro-esophageal reflux disease without esophagitis: Secondary | ICD-10-CM | POA: Diagnosis not present

## 2020-06-04 LAB — CBC WITH DIFFERENTIAL/PLATELET
Absolute Monocytes: 515 cells/uL (ref 200–950)
Basophils Absolute: 28 cells/uL (ref 0–200)
Basophils Relative: 0.3 %
Eosinophils Absolute: 129 cells/uL (ref 15–500)
Eosinophils Relative: 1.4 %
HCT: 44.6 % (ref 38.5–50.0)
Hemoglobin: 15.7 g/dL (ref 13.2–17.1)
Lymphs Abs: 1490 cells/uL (ref 850–3900)
MCH: 32 pg (ref 27.0–33.0)
MCHC: 35.2 g/dL (ref 32.0–36.0)
MCV: 91 fL (ref 80.0–100.0)
MPV: 10.5 fL (ref 7.5–12.5)
Monocytes Relative: 5.6 %
Neutro Abs: 7038 cells/uL (ref 1500–7800)
Neutrophils Relative %: 76.5 %
Platelets: 188 10*3/uL (ref 140–400)
RBC: 4.9 10*6/uL (ref 4.20–5.80)
RDW: 12.9 % (ref 11.0–15.0)
Total Lymphocyte: 16.2 %
WBC: 9.2 10*3/uL (ref 3.8–10.8)

## 2020-06-04 LAB — COMPREHENSIVE METABOLIC PANEL
AG Ratio: 1.8 (calc) (ref 1.0–2.5)
ALT: 21 U/L (ref 9–46)
AST: 25 U/L (ref 10–35)
Albumin: 4.4 g/dL (ref 3.6–5.1)
Alkaline phosphatase (APISO): 61 U/L (ref 35–144)
BUN/Creatinine Ratio: 24 (calc) — ABNORMAL HIGH (ref 6–22)
BUN: 29 mg/dL — ABNORMAL HIGH (ref 7–25)
CO2: 27 mmol/L (ref 20–32)
Calcium: 10.1 mg/dL (ref 8.6–10.3)
Chloride: 97 mmol/L — ABNORMAL LOW (ref 98–110)
Creat: 1.22 mg/dL — ABNORMAL HIGH (ref 0.70–1.11)
Globulin: 2.4 g/dL (calc) (ref 1.9–3.7)
Glucose, Bld: 94 mg/dL (ref 65–139)
Potassium: 4.3 mmol/L (ref 3.5–5.3)
Sodium: 133 mmol/L — ABNORMAL LOW (ref 135–146)
Total Bilirubin: 1 mg/dL (ref 0.2–1.2)
Total Protein: 6.8 g/dL (ref 6.1–8.1)

## 2020-06-04 NOTE — Patient Instructions (Addendum)
Continue Protonix Continue Miralax 1 cap qday and Senna 1 tab qday Stop Pepcid, but can restart if belching/hiccuping recurs Perform blood workup

## 2020-06-04 NOTE — Progress Notes (Signed)
Maylon Peppers, M.D. Gastroenterology & Hepatology [facility] [address] [place], [place] [address]  Primary Care Physician: Mikey Bussing Candida Peeling, MD [address] [place] [address]  I will communicate my assessment and recommendations to the referring MD via EMR. "Note: Occasional unusual wording and randomly placed punctuation marks may result from the use of speech recognition technology to transcribe this document"  Problems: 1. Constipation 2. GERD 3. Recurrent hiccups and belching 4. Dysphagia secondary to ?achalasia  History of Present Illness: Trevor Fuller is a 79 y.o. male with past medical history of advanced Alzheimer disease, history of colon cancer, GERD, recurring constipation, hyperlipidemia, COPD, who presents for follow up of constipation and belching.  The patient was last seen on 04/04/2020. At that time, the patient was counseled to start taking MiraLAX to improve his bowel movements.  Patient was presenting belching episodes in the past, for which he was started on Protonix 20 mg in the morning, while continuing his Pepcid at night.  Patient comes with his wife who provides most of the information.  She reports that he has been doing great as he has not presented any more complaints since they started the recommendations.  She reports that he is taking 1 cap of MiraLAX and 1 pill of senna every day which makes him move his bowels every day or every other day.  He has not presented more abdominal pain or distention since then.  Has been able to take his food as usual but they have had some issues with the intake of vitamin B12 due to the size of the pill and also with intolerant of other stool softeners.  However his food intake has been adequate.  Since he started taking Protonix in the morning he has not presented any more episodes of belching or hiccuping.  The wife would like to know if he can  stop the Pepcid as she does not think it has really helped.   Notably, he is in the process of getting involved for hospice but they would like to continue with the supportive measures he has been receiving in our clinic.  The patient denies having any nausea, vomiting, fever, chills, hematochezia, melena, hematemesis, abdominal distention, abdominal pain, diarrhea, jaundice, pruritus or weight loss.  Past Medical History: Past Medical History:  Diagnosis Date  . Alzheimer's disease ([place])   . Anemia   . Cancer ([place])   . Chronic GERD   . Colon cancer ([place]) 1999  . COPD with asthma ([place])   . Cough   . Dyslipidemia   . H/O left knee surgery 2010  . H/O right knee surgery 2002  . Headache   . Hearing loss    bilateral hearing aids   . High blood pressure   . Hyponatremia   . Kidney stones   . Knee abrasion     Past Surgical History: Past Surgical History:  Procedure Laterality Date  . arthroscopy of knee Bilateral   . bone density study  07/2016  . CARDIAC CATHETERIZATION    . COLONOSCOPY    . inititial lung disease    . SHOULDER SURGERY Bilateral    4 times   . spinal blood patch  07/2017  . WRIST SURGERY Left     Family History: Family History  Problem Relation Age of Onset  . Other Father        brain tumor  . Colon cancer Sister   . Colon cancer  Brother     Social History: Social History   Tobacco Use  Smoking Status Former Smoker  . Packs/day: 0.50  . Years: 36.00  . Pack years: 18.00  . Quit date: 03/14/1990  . Years since quitting: 30.2  Smokeless Tobacco Never Used   Social History   Substance and Sexual Activity  Alcohol Use No   Comment: quit 1991   Social History   Substance and Sexual Activity  Drug Use Never    Allergies: Allergies  Allergen Reactions  . Inhaler Decongestant [Methamphetamine] Other (See Comments)    Causes fungus on vocal cords  . Elavil [Amitriptyline] Other (See Comments)    "Makes me like a zombie" Severe  Lethargy  . Oxycodone Other (See Comments)    Lethargy  . Cymbalta [Duloxetine Hcl]     High blood pressure, low sodium  . Fluticasone-Salmeterol Other (See Comments)    Caused fungus on vocal chords  . Lorazepam Other (See Comments)  . Percocet [Oxycodone-Acetaminophen] Other (See Comments)    Unable to use bathroom; constipation  . Tramadol Anxiety and Other (See Comments)    retention    Medications: Current Outpatient Medications  Medication Sig Dispense Refill  . albuterol (PROAIR HFA) 108 (90 BASE) MCG/ACT inhaler Inhale 2 puffs into the lungs as needed.     Marland Kitchen amLODipine (NORVASC) 10 MG tablet Take 10 mg by mouth daily.    Marland Kitchen b complex vitamins tablet Take 1 tablet by mouth daily.    . benzonatate (TESSALON) 100 MG capsule Take by mouth as needed for cough.    . budesonide (PULMICORT) 0.5 MG/2ML nebulizer solution Take by nebulization daily.     Marland Kitchen Eptinezumab-jjmr (VYEPTI) 100 MG/ML injection Inject into the vein. Infused every 90 days and is administered at [facility].    . famotidine (PEPCID) 20 MG tablet Take 20 mg by mouth daily.     . food thickener (SIMPLYTHICK) POWD Take by mouth as needed.    Marland Kitchen guaiFENesin (MUCINEX PO) Take by mouth 2 (two) times daily as needed.    . hydrALAZINE (APRESOLINE) 25 MG tablet Take 25 mg by mouth 2 (two) times daily.    Marland Kitchen ipratropium-albuterol (DUONEB) 0.5-2.5 (3) MG/3ML SOLN Take 3 mLs by nebulization. 3-4 times daily if needed    . memantine (NAMENDA) 10 MG tablet Take 10 mg by mouth 2 (two) times daily.    . pantoprazole (PROTONIX) 20 MG tablet Take 1 tablet (20 mg total) by mouth daily. 30 tablet 3  . polyethylene glycol (MIRALAX / GLYCOLAX) 17 g packet Take 17 g by mouth daily.    . SENNA PO Take by mouth. 1 by mouth twice a day as needed for constipation.    Marland Kitchen spironolactone (ALDACTONE) 50 MG tablet Take 50 mg by mouth daily.    Marland Kitchen gabapentin (NEURONTIN) 800 MG tablet Take by mouth.    . Vitamin D, Ergocalciferol, (DRISDOL) 1.25 MG (50000  UNIT) CAPS capsule Take 50,000 Units by mouth every 7 (seven) days.     No current facility-administered medications for this visit.    Review of Systems: GENERAL: negative for malaise, night sweats HEENT: No changes in hearing or vision, no nose bleeds or other nasal problems. NECK: Negative for lumps, goiter, pain and significant neck swelling RESPIRATORY: Negative for cough, wheezing CARDIOVASCULAR: Negative for chest pain, leg swelling, palpitations, orthopnea GI: SEE HPI MUSCULOSKELETAL: Negative for joint pain or swelling, back pain, and muscle pain. SKIN: Negative for lesions, rash PSYCH: Negative for sleep disturbance,  mood disorder and recent psychosocial stressors. HEMATOLOGY Negative for prolonged bleeding, bruising easily, and swollen nodes. ENDOCRINE: Negative for cold or heat intolerance, polyuria, polydipsia and goiter. NEURO: negative for tremor, gait imbalance, syncope and seizures. The remainder of the review of systems is noncontributory.   Physical Exam: BP (!) 151/82 (BP Location: Left Arm, Patient Position: Sitting, Cuff Size: Normal)   Pulse 62   Temp 97.7 F (36.5 C) (Oral)   Ht 5\' 7"  (1.702 m)   Wt 169 lb 6.4 oz (76.8 kg)   BMI 26.53 kg/m  GENERAL: The patient is awake but does not talk. Follows commands, in no acute distress. Sitting in chair. HEENT: Head is normocephalic and atraumatic. EOMI are intact. Mouth is well hydrated and without lesions. NECK: Supple. No masses LUNGS: Clear to auscultation. No presence of rhonchi/wheezing/rales. Adequate chest expansion HEART: RRR, normal s1 and s2. ABDOMEN: Soft, nontender, no guarding, no peritoneal signs, and nondistended. BS +. No masses. EXTREMITIES: Without any cyanosis, clubbing, rash, lesions or edema. NEUROLOGIC: follows commands, no focal motor deficit. SKIN: no jaundice, no rashes  Imaging/Labs: as above  I personally reviewed and interpreted the available labs, imaging and endoscopic  files.  Impression and Plan: Trevor Fuller is a 79 y.o. male with past medical history of advanced Alzheimer disease, history of colon cancer, GERD, recurring constipation, hyperlipidemia, COPD, who presents for follow up of constipation and belching.  The patient has had major improvement in his constipation after starting the intake of MiraLAX.  I advised the wife to continue the intake of MiraLAX with the senna as he has done in the past indefinitely to avoid any episodes of constipation in the future.  She understood and agreed.  Regarding his belching and GERD symptoms, they have been well controlled on the pantoprazole which he needs to continue at the same dosage but he can stop the Pepcid for now as it has not provided any benefit on top of the PPI.  We will check a repeat CBC and CMP today.  - Continue Protonix 20 mg qday - Continue Miralax 1 cap qday and Senna 1 tab qday - Stop Pepcid, but can restart if belching/hiccuping recurs - Check CBC, CMP  All questions were answered.      Harvel Quale, MD Gastroenterology and Hepatology [facility]

## 2020-06-27 ENCOUNTER — Other Ambulatory Visit (INDEPENDENT_AMBULATORY_CARE_PROVIDER_SITE_OTHER): Payer: Self-pay

## 2020-06-28 MED ORDER — PANTOPRAZOLE SODIUM 20 MG PO TBEC: 20.0000 mg | DELAYED_RELEASE_TABLET | Freq: Every day | ORAL | 3 refills | Status: AC

## 2020-07-25 DEATH — deceased

## 2022-07-06 ENCOUNTER — Encounter (INDEPENDENT_AMBULATORY_CARE_PROVIDER_SITE_OTHER): Payer: Self-pay | Admitting: Gastroenterology
